# Patient Record
Sex: Male | Born: 1970 | Race: Black or African American | Hispanic: No | Marital: Single
Health system: Southern US, Community
[De-identification: ages and names within clinical notes are randomized; demographics above are authoritative.]

---

## 2007-06-21 ENCOUNTER — Emergency Department (HOSPITAL_COMMUNITY): Admission: EM | Admit: 2007-06-21 | Discharge: 2007-06-21 | Payer: Self-pay | Admitting: Emergency Medicine

## 2007-07-05 ENCOUNTER — Emergency Department (HOSPITAL_COMMUNITY): Admission: EM | Admit: 2007-07-05 | Discharge: 2007-07-05 | Payer: Self-pay | Admitting: Emergency Medicine

## 2007-07-08 ENCOUNTER — Ambulatory Visit: Payer: Self-pay | Admitting: *Deleted

## 2007-08-08 ENCOUNTER — Emergency Department (HOSPITAL_COMMUNITY): Admission: EM | Admit: 2007-08-08 | Discharge: 2007-08-08 | Payer: Self-pay | Admitting: Emergency Medicine

## 2007-08-12 ENCOUNTER — Emergency Department (HOSPITAL_COMMUNITY): Admission: EM | Admit: 2007-08-12 | Discharge: 2007-08-13 | Payer: Self-pay | Admitting: Emergency Medicine

## 2007-08-31 ENCOUNTER — Ambulatory Visit: Payer: Self-pay | Admitting: Internal Medicine

## 2007-10-05 ENCOUNTER — Ambulatory Visit: Payer: Self-pay | Admitting: Family Medicine

## 2011-05-13 LAB — I-STAT 8, (EC8 V) (CONVERTED LAB)
Acid-Base Excess: 2
BUN: 13
Bicarbonate: 27.9 — ABNORMAL HIGH
Chloride: 104
Glucose, Bld: 85
HCT: 43
Hemoglobin: 14.6
Operator id: 192351
Potassium: 3.7
Sodium: 140
TCO2: 29
pCO2, Ven: 45.1
pH, Ven: 7.399 — ABNORMAL HIGH

## 2011-05-13 LAB — INFLUENZA A+B VIRUS AG-DIRECT(RAPID): Inflenza A Ag: NEGATIVE

## 2020-03-24 ENCOUNTER — Emergency Department (HOSPITAL_COMMUNITY): Payer: Self-pay

## 2020-03-24 ENCOUNTER — Emergency Department (HOSPITAL_COMMUNITY)
Admission: EM | Admit: 2020-03-24 | Discharge: 2020-03-24 | Disposition: A | Discharge status: Home / Self Care | Payer: Self-pay | Attending: Emergency Medicine | Admitting: Emergency Medicine

## 2020-03-24 ENCOUNTER — Other Ambulatory Visit: Payer: Self-pay

## 2020-03-24 DIAGNOSIS — Y999 Unspecified external cause status: Secondary | ICD-10-CM | POA: Insufficient documentation

## 2020-03-24 DIAGNOSIS — M25562 Pain in left knee: Secondary | ICD-10-CM | POA: Insufficient documentation

## 2020-03-24 DIAGNOSIS — Y939 Activity, unspecified: Secondary | ICD-10-CM | POA: Insufficient documentation

## 2020-03-24 DIAGNOSIS — S40812A Abrasion of left upper arm, initial encounter: Secondary | ICD-10-CM | POA: Insufficient documentation

## 2020-03-24 DIAGNOSIS — S40811A Abrasion of right upper arm, initial encounter: Secondary | ICD-10-CM | POA: Insufficient documentation

## 2020-03-24 DIAGNOSIS — Y92410 Unspecified street and highway as the place of occurrence of the external cause: Secondary | ICD-10-CM | POA: Insufficient documentation

## 2020-03-24 MED ORDER — ACETAMINOPHEN 500 MG PO TABS
1000.0000 mg | ORAL_TABLET | Freq: Once | ORAL | Status: AC
  Administered 2020-03-24: 1000 mg via ORAL
  Filled 2020-03-24: qty 2

## 2020-03-24 MED ORDER — IBUPROFEN 400 MG PO TABS
600.0000 mg | ORAL_TABLET | Freq: Once | ORAL | Status: AC
  Administered 2020-03-24: 600 mg via ORAL
  Filled 2020-03-24: qty 1
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
## 2020-03-24 NOTE — Discharge Instructions (Addendum)
Take tylenol/motrin as needed for pain control.  Return to ED for any worsening or concerning symptoms.
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
